# Patient Record
Sex: Male | Born: 1980 | Hispanic: No | Marital: Single | Smoking: Current every day smoker
Health system: Southern US, Community
[De-identification: ages and names within clinical notes are randomized; demographics above are authoritative.]

## PROBLEM LIST (undated history)

## (undated) HISTORY — PX: ABDOMINAL SURGERY: SHX537

## (undated) HISTORY — PX: OTHER SURGICAL HISTORY: SHX169

---

## 2004-06-29 ENCOUNTER — Emergency Department (HOSPITAL_COMMUNITY): Admission: AC | Admit: 2004-06-29 | Discharge: 2004-06-30 | Payer: Self-pay
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
## 2020-02-19 ENCOUNTER — Encounter (HOSPITAL_COMMUNITY): Payer: Self-pay | Admitting: Emergency Medicine

## 2020-02-19 ENCOUNTER — Other Ambulatory Visit: Payer: Self-pay

## 2020-02-19 ENCOUNTER — Ambulatory Visit (HOSPITAL_COMMUNITY)
Admission: EM | Admit: 2020-02-19 | Discharge: 2020-02-19 | Disposition: A | Discharge status: Home / Self Care | Payer: BLUE CROSS/BLUE SHIELD | Attending: Urgent Care | Admitting: Urgent Care

## 2020-02-19 DIAGNOSIS — Z7289 Other problems related to lifestyle: Secondary | ICD-10-CM

## 2020-02-19 DIAGNOSIS — R21 Rash and other nonspecific skin eruption: Secondary | ICD-10-CM | POA: Diagnosis not present

## 2020-02-19 DIAGNOSIS — Z789 Other specified health status: Secondary | ICD-10-CM

## 2020-02-19 DIAGNOSIS — R030 Elevated blood-pressure reading, without diagnosis of hypertension: Secondary | ICD-10-CM

## 2020-02-19 DIAGNOSIS — L739 Follicular disorder, unspecified: Secondary | ICD-10-CM

## 2020-02-19 DIAGNOSIS — L989 Disorder of the skin and subcutaneous tissue, unspecified: Secondary | ICD-10-CM

## 2020-02-19 MED ORDER — AMLODIPINE BESYLATE 5 MG PO TABS: 5.0000 mg | ORAL_TABLET | Freq: Every day | ORAL | 0 refills | Status: AC

## 2020-02-19 MED ORDER — HYDROXYZINE HCL 25 MG PO TABS: 12.5000 mg | ORAL_TABLET | Freq: Three times a day (TID) | ORAL | 0 refills | Status: AC | PRN

## 2020-02-19 MED ORDER — TRIAMCINOLONE ACETONIDE 0.1 % EX CREA: 1.0000 "application " | TOPICAL_CREAM | Freq: Two times a day (BID) | CUTANEOUS | 0 refills | Status: AC

## 2020-02-19 NOTE — ED Triage Notes (Signed)
BP high in triage. Checked twice. Denies chest pain, headache, dizziness.
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
## 2020-02-19 NOTE — ED Triage Notes (Signed)
PT reports rash on legs for a week or two. He went to another urgent care and was diagnosed and treated for a fungal infection. PT suspects it may be scabies.

## 2020-08-08 ENCOUNTER — Ambulatory Visit (HOSPITAL_COMMUNITY)
Admission: RE | Admit: 2020-08-08 | Discharge: 2020-08-08 | Disposition: A | Discharge status: Home / Self Care | Payer: Self-pay | Attending: Psychiatry | Admitting: Psychiatry

## 2020-08-08 ENCOUNTER — Emergency Department (HOSPITAL_COMMUNITY)
Admission: EM | Admit: 2020-08-08 | Discharge: 2020-08-08 | Disposition: A | Discharge status: Home / Self Care | Payer: BLUE CROSS/BLUE SHIELD | Attending: Emergency Medicine | Admitting: Emergency Medicine

## 2020-08-08 ENCOUNTER — Encounter (HOSPITAL_COMMUNITY): Payer: Self-pay | Admitting: Emergency Medicine

## 2020-08-08 DIAGNOSIS — F1099 Alcohol use, unspecified with unspecified alcohol-induced disorder: Secondary | ICD-10-CM | POA: Insufficient documentation

## 2020-08-08 DIAGNOSIS — F17210 Nicotine dependence, cigarettes, uncomplicated: Secondary | ICD-10-CM | POA: Insufficient documentation

## 2020-08-08 DIAGNOSIS — R55 Syncope and collapse: Secondary | ICD-10-CM

## 2020-08-08 DIAGNOSIS — F1510 Other stimulant abuse, uncomplicated: Secondary | ICD-10-CM | POA: Insufficient documentation

## 2020-08-08 DIAGNOSIS — F15129 Other stimulant abuse with intoxication, unspecified: Secondary | ICD-10-CM | POA: Insufficient documentation

## 2020-08-08 LAB — ETHANOL: Alcohol, Ethyl (B): <10 mg/dL (ref <10)

## 2020-08-08 LAB — COMPREHENSIVE METABOLIC PANEL
ALT: 13 U/L (ref 0–44)
AST: 12 U/L — ABNORMAL LOW (ref 15–41)
Albumin: 4.4 g/dL (ref 3.5–5.0)
Alkaline Phosphatase: 73 U/L (ref 38–126)
Anion gap: 8 (ref 5–15)
BUN: 15 mg/dL (ref 6–20)
CO2: 24 mmol/L (ref 22–32)
Calcium: 8.9 mg/dL (ref 8.9–10.3)
Chloride: 105 mmol/L (ref 98–111)
Creatinine, Ser: 0.96 mg/dL (ref 0.61–1.24)
GFR calc Af Amer: >60 mL/min (ref >60)
GFR calc non Af Amer: >60 mL/min (ref >60)
Glucose, Bld: 94 mg/dL (ref 70–99)
Potassium: 3.6 mmol/L (ref 3.5–5.1)
Sodium: 137 mmol/L (ref 135–145)
Total Bilirubin: 1 mg/dL (ref 0.3–1.2)
Total Protein: 8 g/dL (ref 6.5–8.1)

## 2020-08-08 LAB — CBC WITH DIFFERENTIAL/PLATELET
Abs Immature Granulocytes: 0.04 10*3/uL (ref 0.00–0.07)
Basophils Absolute: 0.1 10*3/uL (ref 0.0–0.1)
Basophils Relative: 1 %
Eosinophils Absolute: 0.1 10*3/uL (ref 0.0–0.5)
Eosinophils Relative: 1 %
HCT: 46.6 % (ref 39.0–52.0)
Hemoglobin: 15.4 g/dL (ref 13.0–17.0)
Immature Granulocytes: 1 %
Lymphocytes Relative: 26 %
Lymphs Abs: 2.1 10*3/uL (ref 0.7–4.0)
MCH: 30.2 pg (ref 26.0–34.0)
MCHC: 33 g/dL (ref 30.0–36.0)
MCV: 91.4 fL (ref 80.0–100.0)
Monocytes Absolute: 0.5 10*3/uL (ref 0.1–1.0)
Monocytes Relative: 7 %
Neutro Abs: 5.1 10*3/uL (ref 1.7–7.7)
Neutrophils Relative %: 64 %
Platelets: 279 10*3/uL (ref 150–400)
RBC: 5.1 MIL/uL (ref 4.22–5.81)
RDW: 13.5 % (ref 11.5–15.5)
WBC: 7.9 10*3/uL (ref 4.0–10.5)
nRBC: 0 % (ref 0.0–0.2)

## 2020-08-08 LAB — RAPID URINE DRUG SCREEN, HOSP PERFORMED
Amphetamines: POSITIVE — AB
Barbiturates: NOT DETECTED
Benzodiazepines: NOT DETECTED
Cocaine: NOT DETECTED
Opiates: NOT DETECTED
Tetrahydrocannabinol: NOT DETECTED

## 2020-08-08 MED ORDER — SODIUM CHLORIDE 0.9 % IV SOLN: INTRAVENOUS | Status: DC

## 2020-08-08 MED ORDER — SODIUM CHLORIDE 0.9 % IV BOLUS
2000.0000 mL | Freq: Once | INTRAVENOUS | Status: AC
  Administered 2020-08-08: 2000 mL via INTRAVENOUS
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
## 2020-08-08 NOTE — H&P (Signed)
Behavioral Health Medical Screening Exam  Kurt Randolph is an 39 y.o. male.who presented as a walk-in, voluntarily. Patient presented clearly under the influence. When asked about his substance use, he stated that he had been using meth, with last use today. He was unable to say how much he had used today although reported using meth for the last 3 years, daily. He initially  stated that he was suicidal then when re asked the question, he denied suicidal thoughts. He stated that he was hearing voices described as," telling me to wake up" and seeing things described as," shadows."  He stated that both started this morning. He denied prior SA or events of elf-harm. Denied prior inpatient psychiatric hospitalizations or psychiatric history. Denied other substance abuse or use besides methamphetamines. Denied participating in substance abuse treatment programs in the past. Reported no current outpatient psychiatric services. Denied homicidal ideations. Denied access to firearms.   Total Time spent with patient: 30 minutes  Psychiatric Specialty Exam: Physical Exam Psychiatric:        Mood and Affect: Mood normal.     Comments: judgement impaired  Endorses AVH     Review of Systems  Psychiatric/Behavioral:       Substance abuse    There were no vitals taken for this visit.There is no height or weight on file to calculate BMI. General Appearance: Disheveled Eye Contact:  Poor Speech:  Clear and Coherent and Normal Rate Volume:  Normal Mood:  Anxious Affect:  Constricted Thought Process:  Coherent and Descriptions of Associations: Intact Orientation:  Full (Time, Place, and Person) Thought Content:  Hallucinations: Auditory Visual; does not appear to be responding to internal stimuli  Suicidal Thoughts:  No Homicidal Thoughts:  No Memory:  Immediate;   Fair Recent;   Fair Judgement:  Impaired Insight:  Shallow Psychomotor Activity:  Normal Concentration: Concentration: Fair and Attention  Span: Fair Recall:  [employer] of Knowledge:Fair Language: Good Akathisia:  Negative Handed:  Right AIMS (if indicated):    Assets:  Communication Skills Resilience Sleep:     Musculoskeletal: Strength & Muscle Tone: within normal limits Gait & Station: normal Patient leans: N/A  There were no vitals taken for this visit.  Recommendations: Based on my evaluation the patient does not appear to have an emergency medical condition.   No evidence of imminent risk to self or others at present.   Patient does not meet criteria for psychiatric inpatient admission. Substance abuse resources provided. Patient highly encouraged to follow-up with resources.     Denzil Magnuson, NP 08/08/2020, 2:25 PM
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
